# Patient Record
Sex: Male | Born: 1975 | Race: White | Hispanic: No | Marital: Married | Smoking: Current some day smoker
Health system: Southern US, Community
[De-identification: ages and names within clinical notes are randomized; demographics above are authoritative.]

## PROBLEM LIST (undated history)

## (undated) DIAGNOSIS — D179 Benign lipomatous neoplasm, unspecified: Secondary | ICD-10-CM

## (undated) DIAGNOSIS — E291 Testicular hypofunction: Secondary | ICD-10-CM

## (undated) DIAGNOSIS — J189 Pneumonia, unspecified organism: Secondary | ICD-10-CM

## (undated) DIAGNOSIS — I2699 Other pulmonary embolism without acute cor pulmonale: Secondary | ICD-10-CM

## (undated) DIAGNOSIS — K219 Gastro-esophageal reflux disease without esophagitis: Secondary | ICD-10-CM

## (undated) DIAGNOSIS — I82409 Acute embolism and thrombosis of unspecified deep veins of unspecified lower extremity: Secondary | ICD-10-CM

## (undated) DIAGNOSIS — A4902 Methicillin resistant Staphylococcus aureus infection, unspecified site: Secondary | ICD-10-CM

## (undated) DIAGNOSIS — K649 Unspecified hemorrhoids: Secondary | ICD-10-CM

## (undated) DIAGNOSIS — E669 Obesity, unspecified: Secondary | ICD-10-CM

## (undated) DIAGNOSIS — F419 Anxiety disorder, unspecified: Secondary | ICD-10-CM

## (undated) DIAGNOSIS — U071 COVID-19: Secondary | ICD-10-CM

## (undated) HISTORY — DX: Methicillin resistant Staphylococcus aureus infection, unspecified site: A49.02

## (undated) HISTORY — DX: Anxiety disorder, unspecified: F41.9

## (undated) HISTORY — PX: APPENDECTOMY: SHX54

## (undated) HISTORY — PX: FOOT SURGERY: SHX648

## (undated) HISTORY — DX: Other pulmonary embolism without acute cor pulmonale: I26.99

## (undated) HISTORY — PX: COLONOSCOPY: SHX174

## (undated) HISTORY — PX: KNEE ARTHROSCOPY: SHX127

## (undated) HISTORY — DX: Benign lipomatous neoplasm, unspecified: D17.9

## (undated) HISTORY — DX: Obesity, unspecified: E66.9

## (undated) HISTORY — PX: CLOSED REDUCTION ANKLE DISLOCATION: SUR209

## (undated) HISTORY — DX: Pneumonia, unspecified organism: J18.9

## (undated) HISTORY — DX: Unspecified hemorrhoids: K64.9

## (undated) HISTORY — DX: Gastro-esophageal reflux disease without esophagitis: K21.9

## (undated) HISTORY — DX: Testicular hypofunction: E29.1

## (undated) HISTORY — PX: FIBULA FRACTURE SURGERY: SHX947

## (undated) HISTORY — DX: COVID-19: U07.1

## (undated) HISTORY — DX: Acute embolism and thrombosis of unspecified deep veins of unspecified lower extremity: I82.409

---

## 2000-12-13 ENCOUNTER — Emergency Department (HOSPITAL_COMMUNITY): Admission: EM | Admit: 2000-12-13 | Discharge: 2000-12-13 | Payer: Self-pay | Admitting: Emergency Medicine

## 2011-01-18 ENCOUNTER — Other Ambulatory Visit (HOSPITAL_COMMUNITY): Payer: Self-pay | Admitting: Orthopedic Surgery

## 2011-01-18 DIAGNOSIS — M79671 Pain in right foot: Secondary | ICD-10-CM

## 2011-01-18 DIAGNOSIS — M25571 Pain in right ankle and joints of right foot: Secondary | ICD-10-CM

## 2011-01-21 ENCOUNTER — Ambulatory Visit (HOSPITAL_COMMUNITY)
Admission: RE | Admit: 2011-01-21 | Discharge: 2011-01-21 | Disposition: A | Discharge status: Home / Self Care | Payer: Managed Care, Other (non HMO) | Source: Ambulatory Visit | Attending: Orthopedic Surgery | Admitting: Orthopedic Surgery

## 2011-01-21 DIAGNOSIS — M79609 Pain in unspecified limb: Secondary | ICD-10-CM | POA: Insufficient documentation

## 2011-01-21 DIAGNOSIS — Z9889 Other specified postprocedural states: Secondary | ICD-10-CM | POA: Insufficient documentation

## 2011-01-21 DIAGNOSIS — M79671 Pain in right foot: Secondary | ICD-10-CM

## 2011-01-21 DIAGNOSIS — M25571 Pain in right ankle and joints of right foot: Secondary | ICD-10-CM

## 2011-01-21 MED ORDER — TECHNETIUM TC 99M MEDRONATE IV KIT
25.0000 | PACK | Freq: Once | INTRAVENOUS | Status: AC | PRN
  Administered 2011-01-21: 25 via INTRAVENOUS

## 2011-02-22 ENCOUNTER — Inpatient Hospital Stay: Payer: Self-pay | Admitting: Podiatry

## 2011-02-23 DIAGNOSIS — R079 Chest pain, unspecified: Secondary | ICD-10-CM

## 2011-03-03 ENCOUNTER — Other Ambulatory Visit: Payer: Self-pay | Admitting: Podiatry

## 2011-03-05 ENCOUNTER — Ambulatory Visit: Payer: Self-pay | Admitting: Podiatry

## 2012-10-12 ENCOUNTER — Ambulatory Visit: Payer: Self-pay | Admitting: Podiatry

## 2014-03-31 ENCOUNTER — Emergency Department (HOSPITAL_BASED_OUTPATIENT_CLINIC_OR_DEPARTMENT_OTHER)
Admission: EM | Admit: 2014-03-31 | Discharge: 2014-03-31 | Disposition: A | Discharge status: Home / Self Care | Payer: BC Managed Care – PPO | Attending: Emergency Medicine | Admitting: Emergency Medicine

## 2014-03-31 ENCOUNTER — Emergency Department (HOSPITAL_BASED_OUTPATIENT_CLINIC_OR_DEPARTMENT_OTHER): Payer: BC Managed Care – PPO

## 2014-03-31 ENCOUNTER — Encounter (HOSPITAL_BASED_OUTPATIENT_CLINIC_OR_DEPARTMENT_OTHER): Payer: Self-pay

## 2014-03-31 DIAGNOSIS — R1110 Vomiting, unspecified: Secondary | ICD-10-CM

## 2014-03-31 DIAGNOSIS — E860 Dehydration: Secondary | ICD-10-CM | POA: Insufficient documentation

## 2014-03-31 DIAGNOSIS — R197 Diarrhea, unspecified: Secondary | ICD-10-CM | POA: Diagnosis not present

## 2014-03-31 LAB — URINALYSIS, ROUTINE W REFLEX MICROSCOPIC
Bilirubin Urine: NEGATIVE
Glucose, UA: NEGATIVE mg/dL
Hgb urine dipstick: NEGATIVE
KETONES UR: 15 mg/dL — AB
LEUKOCYTES UA: NEGATIVE
NITRITE: NEGATIVE
PH: 7.5 (ref 5.0–8.0)
Protein, ur: NEGATIVE mg/dL
SPECIFIC GRAVITY, URINE: 1.03 (ref 1.005–1.030)
Urobilinogen, UA: 1 mg/dL (ref 0.0–1.0)

## 2014-03-31 LAB — CBC WITH DIFFERENTIAL/PLATELET
BASOS ABS: 0 10*3/uL (ref 0.0–0.1)
Basophils Relative: 0 % (ref 0–1)
EOS PCT: 1 % (ref 0–5)
Eosinophils Absolute: 0.2 10*3/uL (ref 0.0–0.7)
HEMATOCRIT: 47.8 % (ref 39.0–52.0)
HEMOGLOBIN: 16.3 g/dL (ref 13.0–17.0)
Lymphocytes Relative: 3 % — ABNORMAL LOW (ref 12–46)
Lymphs Abs: 0.4 10*3/uL — ABNORMAL LOW (ref 0.7–4.0)
MCH: 30.2 pg (ref 26.0–34.0)
MCHC: 34.1 g/dL (ref 30.0–36.0)
MCV: 88.5 fL (ref 78.0–100.0)
Monocytes Absolute: 0.5 10*3/uL (ref 0.1–1.0)
Monocytes Relative: 5 % (ref 3–12)
NEUTROS ABS: 9.6 10*3/uL — AB (ref 1.7–7.7)
Neutrophils Relative %: 91 % — ABNORMAL HIGH (ref 43–77)
Platelets: 191 10*3/uL (ref 150–400)
RBC: 5.4 MIL/uL (ref 4.22–5.81)
RDW: 12.5 % (ref 11.5–15.5)
WBC: 10.6 10*3/uL — ABNORMAL HIGH (ref 4.0–10.5)

## 2014-03-31 LAB — COMPREHENSIVE METABOLIC PANEL
ALBUMIN: 3.9 g/dL (ref 3.5–5.2)
ALT: 33 U/L (ref 0–53)
ANION GAP: 8 (ref 5–15)
AST: 30 U/L (ref 0–37)
Alkaline Phosphatase: 99 U/L (ref 39–117)
BUN: 21 mg/dL (ref 6–23)
CO2: 25 mmol/L (ref 19–32)
Calcium: 8.7 mg/dL (ref 8.4–10.5)
Chloride: 105 mEq/L (ref 96–112)
Creatinine, Ser: 1.1 mg/dL (ref 0.50–1.35)
GFR calc Af Amer: >90 mL/min (ref >90)
GFR calc non Af Amer: 85 mL/min — ABNORMAL LOW (ref >90)
GLUCOSE: 105 mg/dL — AB (ref 70–99)
Potassium: 4 mmol/L (ref 3.5–5.1)
SODIUM: 138 mmol/L (ref 135–145)
TOTAL PROTEIN: 7.1 g/dL (ref 6.0–8.3)
Total Bilirubin: 0.7 mg/dL (ref 0.3–1.2)

## 2014-03-31 LAB — LIPASE, BLOOD: Lipase: 40 U/L (ref 11–59)

## 2014-03-31 MED ORDER — ONDANSETRON HCL 4 MG/2ML IJ SOLN
4.0000 mg | Freq: Once | INTRAMUSCULAR | Status: AC
  Administered 2014-03-31: 4 mg via INTRAVENOUS
  Filled 2014-03-31: qty 2

## 2014-03-31 MED ORDER — ACETAMINOPHEN 500 MG PO TABS
1000.0000 mg | ORAL_TABLET | Freq: Once | ORAL | Status: DC
  Filled 2014-03-31: qty 2

## 2014-03-31 MED ORDER — SODIUM CHLORIDE 0.9 % IV BOLUS (SEPSIS)
1000.0000 mL | Freq: Once | INTRAVENOUS | Status: AC
  Administered 2014-03-31: 1000 mL via INTRAVENOUS

## 2014-03-31 MED ORDER — ONDANSETRON HCL 4 MG PO TABS: 4.0000 mg | ORAL_TABLET | Freq: Three times a day (TID) | ORAL | Status: AC | PRN

## 2014-03-31 NOTE — ED Notes (Signed)
Patient reports that he awoke with abdominal cramping, nausea, general bodyaches. Also reports that he developed diarrhea with same

## 2014-03-31 NOTE — Discharge Instructions (Signed)
Stay hydrated.   Take zofran for nausea.   You can take imodium as needed for diarrhea. Take up to 3 times daily.   Follow up with your doctor.   Return to ER if you have severe pain, vomiting, fevers, dehydration.

## 2014-03-31 NOTE — ED Provider Notes (Signed)
CSN: [id number]     Arrival date & time 03/31/14  1018 History   First MD Initiated Contact with Patient 03/31/14 1110     Chief Complaint  Patient presents with  . nausea, diarrhea      (Consider location/radiation/quality/duration/timing/severity/associated sxs/prior Treatment) The history is provided by the patient.  Guy Matthews is a 39 y.o. male hx of appendectomy here with abdominal cramps, nausea, general body aches. Woke up this morning with acute onset of abdominal cramps. Also has 15-20 episodes of watery diarrhea. Has some subjective chills and general body aches as well. Denies any recent antibiotic use. Denies any recent travels. No history of SBO.    History reviewed. No pertinent past medical history. History reviewed. No pertinent past surgical history. No family history on file. History  Substance Use Topics  . Smoking status: Never Smoker   . Smokeless tobacco: Not on file  . Alcohol Use: Not on file    Review of Systems  Gastrointestinal: Positive for nausea, abdominal pain and diarrhea.  All other systems reviewed and are negative.     Allergies  Review of patient's allergies indicates no known allergies.  Home Medications   Prior to Admission medications   Medication Sig Start Date End Date Taking? Authorizing Provider  diazepam (VALIUM) 5 MG tablet Take 5 mg by mouth every 12 (twelve) hours as needed for anxiety.   Yes Historical Provider, MD   BP 128/66 mmHg  Pulse 78  Temp(Src) 99.2 F (37.3 C) (Oral)  Resp 18  Wt 220 lb (99.791 kg)  SpO2 100% Physical Exam  Constitutional: He is oriented to person, place, and time.  Uncomfortable, dehydrated   HENT:  Head: Normocephalic.  MM slightly dry   Eyes: Conjunctivae are normal. Pupils are equal, round, and reactive to light.  Neck: Normal range of motion. Neck supple.  Cardiovascular: Normal rate, regular rhythm and normal heart sounds.   Pulmonary/Chest: Effort normal and breath sounds normal.  No respiratory distress. He has no wheezes. He has no rales.  Abdominal: Soft. Bowel sounds are normal. He exhibits no distension. There is no tenderness. There is no rebound and no guarding.  Musculoskeletal: Normal range of motion. He exhibits no edema or tenderness.  Neurological: He is alert and oriented to person, place, and time. No cranial nerve deficit. Coordination normal.  Skin: Skin is warm and dry.  Psychiatric: He has a normal mood and affect. His behavior is normal. Judgment normal.  Nursing note and vitals reviewed.   ED Course  Procedures (including critical care time) Labs Review Labs Reviewed  URINALYSIS, ROUTINE W REFLEX MICROSCOPIC - Abnormal; Notable for the following:    Ketones, ur 15 (*)    All other components within normal limits  CBC WITH DIFFERENTIAL - Abnormal; Notable for the following:    WBC 10.6 (*)    Neutrophils Relative % 91 (*)    Neutro Abs 9.6 (*)    Lymphocytes Relative 3 (*)    Lymphs Abs 0.4 (*)    All other components within normal limits  COMPREHENSIVE METABOLIC PANEL - Abnormal; Notable for the following:    Glucose, Bld 105 (*)    GFR calc non Af Amer 85 (*)    All other components within normal limits  LIPASE, BLOOD    Imaging Review Dg Abd Acute W/chest  03/31/2014   CLINICAL DATA:  Abdominal cramping, nausea, vomiting and diarrhea this morning.  EXAM: ACUTE ABDOMEN SERIES (ABDOMEN 2 VIEW & CHEST 1 VIEW)  COMPARISON:  None.  FINDINGS: There is no evidence of dilated bowel loops or free intraperitoneal air. No radiopaque calculi or other significant radiographic abnormality is seen. Patient is status post prior cholecystectomy. Heart size and mediastinal contours are within normal limits. Both lungs are clear.  IMPRESSION: No bowel obstruction or free air.  No acute cardiopulmonary disease.   Electronically Signed   By: Abelardo Diesel M.D.   On: 03/31/2014 11:49     EKG Interpretation None      MDM   Final diagnoses:  Vomiting     Guy MABEE is a 39 y.o. male here with vomiting, diarrhea. Likely gastro. Will check labs and hydrate. Hx of appendectomy so will get xray to r/o SBO.   1:44 PM Xray showed no SBO. Small amount of ketones in urine but no UTI. Labs unremarkable. Tolerated PO. Will dc home. I told him that he can take imodium prn.     Wandra Arthurs, MD 03/31/14 1345

## 2014-07-05 ENCOUNTER — Encounter (HOSPITAL_COMMUNITY): Payer: Self-pay | Admitting: Emergency Medicine

## 2014-07-05 ENCOUNTER — Emergency Department (HOSPITAL_COMMUNITY): Admission: EM | Admit: 2014-07-05 | Discharge: 2014-07-05 | Discharge status: Absent without leave | Payer: Self-pay

## 2014-07-05 ENCOUNTER — Emergency Department (INDEPENDENT_AMBULATORY_CARE_PROVIDER_SITE_OTHER)
Admission: EM | Admit: 2014-07-05 | Discharge: 2014-07-05 | Disposition: A | Discharge status: Home / Self Care | Payer: Self-pay | Source: Home / Self Care | Attending: Family Medicine | Admitting: Family Medicine

## 2014-07-05 DIAGNOSIS — S060X0A Concussion without loss of consciousness, initial encounter: Secondary | ICD-10-CM

## 2014-07-05 NOTE — Discharge Instructions (Signed)
I want you to go home and relax for at least the next 2 days. If you feel back to normal, you may return to normal daily activities as long as you remain asymptomatic. You may then return to normal physical activity and exercise, again only if you remain asymptomatic.   If your symptoms worsen, go to the emergency department.  Concussion A concussion, or closed-head injury, is a brain injury caused by a direct blow to the head or by a quick and sudden movement (jolt) of the head or neck. Concussions are usually not life-threatening. Even so, the effects of a concussion can be serious. If you have had a concussion before, you are more likely to experience concussion-like symptoms after a direct blow to the head.  CAUSES  Direct blow to the head, such as from running into another player during a soccer game, being hit in a fight, or hitting your head on a hard surface.  A jolt of the head or neck that causes the brain to move back and forth inside the skull, such as in a car crash. SIGNS AND SYMPTOMS The signs of a concussion can be hard to notice. Early on, they may be missed by you, family members, and health care providers. You may look fine but act or feel differently. Symptoms are usually temporary, but they may last for days, weeks, or even longer. Some symptoms may appear right away while others may not show up for hours or days. Every head injury is different. Symptoms include:  Mild to moderate headaches that will not go away.  A feeling of pressure inside your head.  Having more trouble than usual:  Learning or remembering things you have heard.  Answering questions.  Paying attention or concentrating.  Organizing daily tasks.  Making decisions and solving problems.  Slowness in thinking, acting or reacting, speaking, or reading.  Getting lost or being easily confused.  Feeling tired all the time or lacking energy (fatigued).  Feeling drowsy.  Sleep  disturbances.  Sleeping more than usual.  Sleeping less than usual.  Trouble falling asleep.  Trouble sleeping (insomnia).  Loss of balance or feeling lightheaded or dizzy.  Nausea or vomiting.  Numbness or tingling.  Increased sensitivity to:  Sounds.  Lights.  Distractions.  Vision problems or eyes that tire easily.  Diminished sense of taste or smell.  Ringing in the ears.  Mood changes such as feeling sad or anxious.  Becoming easily irritated or angry for little or no reason.  Lack of motivation.  Seeing or hearing things other people do not see or hear (hallucinations). DIAGNOSIS Your health care provider can usually diagnose a concussion based on a description of your injury and symptoms. He or she will ask whether you passed out (lost consciousness) and whether you are having trouble remembering events that happened right before and during your injury. Your evaluation might include:  A brain scan to look for signs of injury to the brain. Even if the test shows no injury, you may still have a concussion.  Blood tests to be sure other problems are not present. TREATMENT  Concussions are usually treated in an emergency department, in urgent care, or at a clinic. You may need to stay in the hospital overnight for further treatment.  Tell your health care provider if you are taking any medicines, including prescription medicines, over-the-counter medicines, and natural remedies. Some medicines, such as blood thinners (anticoagulants) and aspirin, may increase the chance of complications. Also tell  your health care provider whether you have had alcohol or are taking illegal drugs. This information may affect treatment.  Your health care provider will send you home with important instructions to follow.  How fast you will recover from a concussion depends on many factors. These factors include how severe your concussion is, what part of your brain was injured,  your age, and how healthy you were before the concussion.  Most people with mild injuries recover fully. Recovery can take time. In general, recovery is slower in older persons. Also, persons who have had a concussion in the past or have other medical problems may find that it takes longer to recover from their current injury. HOME CARE INSTRUCTIONS General Instructions  Carefully follow the directions your health care provider gave you.  Only take over-the-counter or prescription medicines for pain, discomfort, or fever as directed by your health care provider.  Take only those medicines that your health care provider has approved.  Do not drink alcohol until your health care provider says you are well enough to do so. Alcohol and certain other drugs may slow your recovery and can put you at risk of further injury.  If it is harder than usual to remember things, write them down.  If you are easily distracted, try to do one thing at a time. For example, do not try to watch TV while fixing dinner.  Talk with family members or close friends when making important decisions.  Keep all follow-up appointments. Repeated evaluation of your symptoms is recommended for your recovery.  Watch your symptoms and tell others to do the same. Complications sometimes occur after a concussion. Older adults with a brain injury may have a higher risk of serious complications, such as a blood clot on the brain.  Tell your teachers, school nurse, school counselor, coach, athletic trainer, or work Freight forwarder about your injury, symptoms, and restrictions. Tell them about what you can or cannot do. They should watch for:  Increased problems with attention or concentration.  Increased difficulty remembering or learning new information.  Increased time needed to complete tasks or assignments.  Increased irritability or decreased ability to cope with stress.  Increased symptoms.  Rest. Rest helps the brain to  heal. Make sure you:  Get plenty of sleep at night. Avoid staying up late at night.  Keep the same bedtime hours on weekends and weekdays.  Rest during the day. Take daytime naps or rest breaks when you feel tired.  Limit activities that require a lot of thought or concentration. These include:  Doing homework or job-related work.  Watching TV.  Working on the computer.  Avoid any situation where there is potential for another head injury (football, hockey, soccer, basketball, martial arts, downhill snow sports and horseback riding). Your condition will get worse every time you experience a concussion. You should avoid these activities until you are evaluated by the appropriate follow-up health care providers. Returning To Your Regular Activities You will need to return to your normal activities slowly, not all at once. You must give your body and brain enough time for recovery.  Do not return to sports or other athletic activities until your health care provider tells you it is safe to do so.  Ask your health care provider when you can drive, ride a bicycle, or operate heavy machinery. Your ability to react may be slower after a brain injury. Never do these activities if you are dizzy.  Ask your health care provider about  when you can return to work or school. Preventing Another Concussion It is very important to avoid another brain injury, especially before you have recovered. In rare cases, another injury can lead to permanent brain damage, brain swelling, or death. The risk of this is greatest during the first 7-10 days after a head injury. Avoid injuries by:  Wearing a seat belt when riding in a car.  Drinking alcohol only in moderation.  Wearing a helmet when biking, skiing, skateboarding, skating, or doing similar activities.  Avoiding activities that could lead to a second concussion, such as contact or recreational sports, until your health care provider says it is  okay.  Taking safety measures in your home.  Remove clutter and tripping hazards from floors and stairways.  Use grab bars in bathrooms and handrails by stairs.  Place non-slip mats on floors and in bathtubs.  Improve lighting in dim areas. SEEK MEDICAL CARE IF:  You have increased problems paying attention or concentrating.  You have increased difficulty remembering or learning new information.  You need more time to complete tasks or assignments than before.  You have increased irritability or decreased ability to cope with stress.  You have more symptoms than before. Seek medical care if you have any of the following symptoms for more than 2 weeks after your injury:  Lasting (chronic) headaches.  Dizziness or balance problems.  Nausea.  Vision problems.  Increased sensitivity to noise or light.  Depression or mood swings.  Anxiety or irritability.  Memory problems.  Difficulty concentrating or paying attention.  Sleep problems.  Feeling tired all the time. SEEK IMMEDIATE MEDICAL CARE IF:  You have severe or worsening headaches. These may be a sign of a blood clot in the brain.  You have weakness (even if only in one hand, leg, or part of the face).  You have numbness.  You have decreased coordination.  You vomit repeatedly.  You have increased sleepiness.  One pupil is larger than the other.  You have convulsions.  You have slurred speech.  You have increased confusion. This may be a sign of a blood clot in the brain.  You have increased restlessness, agitation, or irritability.  You are unable to recognize people or places.  You have neck pain.  It is difficult to wake you up.  You have unusual behavior changes.  You lose consciousness. MAKE SURE YOU:  Understand these instructions.  Will watch your condition.  Will get help right away if you are not doing well or get worse. Document Released: 06/05/2003 Document Revised:  03/20/2013 Document Reviewed: 10/05/2012 [facility] Patient Information 2015 [place], [place]. This information is not intended to replace advice given to you by your health care provider. Make sure you discuss any questions you have with your health care provider.
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
## 2018-05-24 ENCOUNTER — Telehealth: Payer: Self-pay | Admitting: Hematology and Oncology

## 2018-05-24 NOTE — Telephone Encounter (Signed)
A new hem appt has been scheduled for the pt to see Dr. Lindi Adie on 2/29 at 8am. Pt aware to arrive 15 minutes early to be checked in on time.

## 2018-05-27 ENCOUNTER — Inpatient Hospital Stay: Payer: BC Managed Care – PPO

## 2018-05-27 ENCOUNTER — Inpatient Hospital Stay: Payer: BC Managed Care – PPO | Attending: Hematology and Oncology | Admitting: Hematology and Oncology

## 2018-05-27 ENCOUNTER — Telehealth: Payer: Self-pay | Admitting: Hematology and Oncology

## 2018-05-27 DIAGNOSIS — F419 Anxiety disorder, unspecified: Secondary | ICD-10-CM

## 2018-05-27 DIAGNOSIS — I82412 Acute embolism and thrombosis of left femoral vein: Secondary | ICD-10-CM | POA: Insufficient documentation

## 2018-05-27 DIAGNOSIS — I2693 Single subsegmental pulmonary embolism without acute cor pulmonale: Secondary | ICD-10-CM | POA: Insufficient documentation

## 2018-05-27 DIAGNOSIS — Z8614 Personal history of Methicillin resistant Staphylococcus aureus infection: Secondary | ICD-10-CM | POA: Insufficient documentation

## 2018-05-27 DIAGNOSIS — Z7901 Long term (current) use of anticoagulants: Secondary | ICD-10-CM

## 2018-05-27 DIAGNOSIS — Z79899 Other long term (current) drug therapy: Secondary | ICD-10-CM | POA: Diagnosis not present

## 2018-05-27 DIAGNOSIS — D6862 Lupus anticoagulant syndrome: Secondary | ICD-10-CM

## 2018-05-27 DIAGNOSIS — I2699 Other pulmonary embolism without acute cor pulmonale: Secondary | ICD-10-CM | POA: Insufficient documentation

## 2018-05-27 NOTE — Telephone Encounter (Signed)
Scheduled per los, declined printout  ° °

## 2018-05-27 NOTE — Progress Notes (Signed)
[place] NOTE  Patient Care Team: Tisovec, Fransico Him, MD as PCP - General (Internal Medicine)  CHIEF COMPLAINTS/PURPOSE OF CONSULTATION:  DVT and PE  HISTORY OF PRESENTING ILLNESS:  Guy Matthews 43 y.o. male is here because of recent diagnosis of left leg DVT and PE in November 2019.  This was following a fracture of the toe which required immobilization of the foot from August to September.  He presented to his podiatrist with her left leg swelling and ultrasound revealed a left leg DVT in his femoral and popliteal veins.  He was sent for CT scan of the chest which revealed a small pulmonary embolism. Small filling defect within a left lower lobe subsegmental branch where there is suggestion of mild arterial expansion, suspicious for small subsegmental pulmonary embolus. No other evidence for pulmonary embolus. No CT features of right heart strain. He was started on anticoagulation with Xarelto and he was sent for hypercoagulability testing which revealed positivity for lupus anticoagulant.  Because of this he was referred to [place] for hematology evaluation.  MEDICAL HISTORY:  MRSA skin infection, anxiety SURGICAL HISTORY: Fibular fracture, ankle dislocation, foot fracture SOCIAL HISTORY: Does not smoke cigarettes, denies recreational drug use, occasionally drinks beer FAMILY HISTORY: Father had a heart attack and had COPD mother also heart disease ALLERGIES:  has No Known Allergies.  MEDICATIONS:  Current Outpatient Medications  Medication Sig Dispense Refill  . diazepam (VALIUM) 5 MG tablet Take 5 mg by mouth every 12 (twelve) hours as needed for anxiety.    . ondansetron (ZOFRAN) 4 MG tablet Take 1 tablet (4 mg total) by mouth every 8 (eight) hours as needed for nausea or vomiting. 12 tablet 0   No current facility-administered medications for this visit.     REVIEW OF SYSTEMS:   Constitutional: Denies fevers, chills or abnormal night sweats Eyes: Denies  blurriness of vision, double vision or watery eyes Ears, nose, mouth, throat, and face: Denies mucositis or sore throat Respiratory: Denies cough, dyspnea or wheezes Cardiovascular: Denies palpitation, chest discomfort or lower extremity swelling Gastrointestinal:  Denies nausea, heartburn or change in bowel habits Skin: Denies abnormal skin rashes Lymphatics: Denies new lymphadenopathy or easy bruising Neurological:Denies numbness, tingling or new weaknesses Behavioral/Psych: Mood is stable, no new changes   All other systems were reviewed with the patient and are negative.  PHYSICAL EXAMINATION: ECOG PERFORMANCE STATUS: 1 - Symptomatic but completely ambulatory  Vitals:   05/27/18 0810  BP: (!) 126/57  Pulse: (!) 57  Resp: 18  Temp: 97.8 F (36.6 C)  SpO2: 99%   Filed Weights   05/27/18 0810  Weight: 227 lb 14.4 oz (103.4 kg)    GENERAL:alert, no distress and comfortable SKIN: skin color, texture, turgor are normal, no rashes or significant lesions EYES: normal, conjunctiva are pink and non-injected, sclera clear OROPHARYNX:no exudate, no erythema and lips, buccal mucosa, and tongue normal  NECK: supple, thyroid normal size, non-tender, without nodularity LYMPH:  no palpable lymphadenopathy in the cervical, axillary or inguinal LUNGS: clear to auscultation and percussion with normal breathing effort HEART: regular rate & rhythm and no murmurs and no lower extremity edema ABDOMEN:abdomen soft, non-tender and normal bowel sounds Musculoskeletal:no cyanosis of digits and no clubbing  PSYCH: alert & oriented x 3 with fluent speech NEURO: no focal motor/sensory deficits .  Patient still  LABORATORY DATA:  I have reviewed the data as listed Lab Results  Component Value Date   WBC 10.6 (H) 03/31/2014  HGB 16.3 03/31/2014   HCT 47.8 03/31/2014   MCV 88.5 03/31/2014   PLT 191 03/31/2014   Lab Results  Component Value Date   NA 138 03/31/2014   K 4.0 03/31/2014   CL  105 03/31/2014   CO2 25 03/31/2014    RADIOGRAPHIC STUDIES: I have personally reviewed the radiological reports and agreed with the findings in the report.  ASSESSMENT AND PLAN:  Pulmonary embolism (HCC) Left lower extremity DVT and PE diagnosed in November when he was seen by podiatry for unexplained left lower extremity swelling.  He underwent ultrasound which revealed DVT in the left femoral vein extending into popliteal and peroneal vein.  [facility] emergency department performed CT angiogram of the chest which revealed PE  Current treatment: Xarelto started November 2019. Possible etiology: Left toe fracture placed in "moonboot", history of multiple leg fractures Lab test performed by Dr. Osborne Casco 1.  Protein S: 88% 2.  Protein C: 118% 3.  Antithrombin activity: 126% 4.  Lupus anticoagulant: Positive for lupus anticoagulant 5.  Prothrombin gene mutation negative 6.  Factor V Leiden: Negative, no mutation identified 7.  Beta-2 GP 1 antibody: Negative 8.  ANA negative  Antiphospholipid antibody syndrome: I discussed with the patient that the presence of lupus anticoagulant indicates that he has antiphospholipid antibody syndrome.  However this needs to be verified and proven.  We would like to run the test today to verify the consistent presence of this antibody to confirm the syndrome.  The implications of positive repeat testing would be that he would need to remain on anticoagulation for life.  If the test is negative then we could consider the immobilization of his leg as the primary etiology and 6 months of anticoagulation would be adequate based upon the fact that this is his first blood clot.  I will call him with the result of this test. Because there is a concern for interaction between Xarelto and the lupus anticoagulant testing, we also plan to repeat this test in May after he finishes 6 months of Xarelto therapy and he holds the Xarelto for 3 days prior to the lab testing. If  the repeat testing is negative then he does not need further anticoagulation.   All questions were answered. The patient knows to call the clinic with any problems, questions or concerns.    Harriette Ohara, MD 05/27/18

## 2018-05-27 NOTE — Assessment & Plan Note (Signed)
Left lower extremity DVT and PE diagnosed in November when he was seen by podiatry for unexplained left lower extremity swelling.  He underwent ultrasound which revealed DVT in the left femoral vein extending into popliteal and peroneal vein.  [facility] emergency department performed CT angiogram of the chest which revealed PE  Current treatment: Xarelto started November 2019. Possible etiology: Left toe fracture placed in "moonboot", history of multiple leg fractures Lab test performed by Dr. Osborne Casco 1.  Protein S: 88% 2.  Protein C: 118% 3.  Antithrombin activity: 126% 4.  Lupus anticoagulant: Positive for lupus anticoagulant 5.  Prothrombin gene mutation negative 6.  Factor V Leiden: Negative, no mutation identified 7.  Beta-2 GP 1 antibody: Negative 8.  ANA negative  Antiphospholipid antibody syndrome: I discussed with the patient that the presence of lupus anticoagulant indicates that he has antiphospholipid antibody syndrome.  However this needs to be verified and proven.  We would like to run the test today to verify the consistent presence of this antibody to confirm the syndrome.  The implications of positive repeat testing would be that he would need to remain on anticoagulation for life.  If the test is negative then we could consider the immobilization of his leg as the primary etiology and 6 months of anticoagulation would be adequate based upon the fact that this is his first blood clot.  I will call him with the result of this test.

## 2018-05-29 ENCOUNTER — Inpatient Hospital Stay: Payer: BC Managed Care – PPO

## 2018-05-29 LAB — LUPUS ANTICOAGULANT PANEL
DRVVT: 66.8 s — AB (ref 0.0–47.0)
PTT Lupus Anticoagulant: 36.8 s (ref 0.0–51.9)

## 2018-05-29 LAB — DRVVT CONFIRM: dRVVT Confirm: 1.6 ratio — ABNORMAL HIGH (ref 0.8–1.2)

## 2018-05-29 LAB — DRVVT MIX: dRVVT Mix: 50.6 s — ABNORMAL HIGH (ref 0.0–47.0)

## 2018-05-30 ENCOUNTER — Telehealth: Payer: Self-pay | Admitting: Hematology and Oncology

## 2018-05-30 NOTE — Telephone Encounter (Signed)
I informed the patient that the repeat testing for the lupus anticoagulant was positive. We discussed the risk of false positivity while patient is on anticoagulation. The plan is to recheck lupus anticoagulant test few weeks after discontinuing Xarelto.  Given the cost factor of coming to our office, he may look to obtain this test with Dr. Loren Racer office. He will inform as how we can help get this test done after stopping Xarelto.

## 2018-06-27 DIAGNOSIS — S80812A Abrasion, left lower leg, initial encounter: Secondary | ICD-10-CM | POA: Diagnosis not present

## 2018-06-27 DIAGNOSIS — I82402 Acute embolism and thrombosis of unspecified deep veins of left lower extremity: Secondary | ICD-10-CM | POA: Diagnosis not present

## 2018-06-27 DIAGNOSIS — I2699 Other pulmonary embolism without acute cor pulmonale: Secondary | ICD-10-CM | POA: Diagnosis not present

## 2018-07-05 DIAGNOSIS — F331 Major depressive disorder, recurrent, moderate: Secondary | ICD-10-CM | POA: Diagnosis not present

## 2018-07-12 ENCOUNTER — Other Ambulatory Visit: Payer: Self-pay | Admitting: *Deleted

## 2018-07-12 DIAGNOSIS — C20 Malignant neoplasm of rectum: Secondary | ICD-10-CM

## 2018-07-13 DIAGNOSIS — F331 Major depressive disorder, recurrent, moderate: Secondary | ICD-10-CM | POA: Diagnosis not present

## 2018-07-19 DIAGNOSIS — F331 Major depressive disorder, recurrent, moderate: Secondary | ICD-10-CM | POA: Diagnosis not present

## 2018-07-26 DIAGNOSIS — F331 Major depressive disorder, recurrent, moderate: Secondary | ICD-10-CM | POA: Diagnosis not present

## 2018-08-01 ENCOUNTER — Inpatient Hospital Stay: Payer: BC Managed Care – PPO | Attending: Hematology and Oncology

## 2018-08-02 DIAGNOSIS — F331 Major depressive disorder, recurrent, moderate: Secondary | ICD-10-CM | POA: Diagnosis not present

## 2018-08-16 ENCOUNTER — Other Ambulatory Visit: Payer: BC Managed Care – PPO

## 2018-09-05 DIAGNOSIS — Z13220 Encounter for screening for lipoid disorders: Secondary | ICD-10-CM | POA: Diagnosis not present

## 2018-09-05 DIAGNOSIS — Z7901 Long term (current) use of anticoagulants: Secondary | ICD-10-CM | POA: Diagnosis not present

## 2018-09-05 DIAGNOSIS — Z86711 Personal history of pulmonary embolism: Secondary | ICD-10-CM | POA: Diagnosis not present

## 2018-09-05 DIAGNOSIS — Z86718 Personal history of other venous thrombosis and embolism: Secondary | ICD-10-CM | POA: Diagnosis not present

## 2018-09-05 DIAGNOSIS — Z131 Encounter for screening for diabetes mellitus: Secondary | ICD-10-CM | POA: Diagnosis not present

## 2018-09-13 DIAGNOSIS — F4322 Adjustment disorder with anxiety: Secondary | ICD-10-CM | POA: Diagnosis not present

## 2018-10-26 DIAGNOSIS — Z86711 Personal history of pulmonary embolism: Secondary | ICD-10-CM | POA: Diagnosis not present

## 2018-10-26 DIAGNOSIS — Z7901 Long term (current) use of anticoagulants: Secondary | ICD-10-CM | POA: Diagnosis not present

## 2018-10-26 DIAGNOSIS — Z86718 Personal history of other venous thrombosis and embolism: Secondary | ICD-10-CM | POA: Diagnosis not present

## 2018-11-02 DIAGNOSIS — Z86718 Personal history of other venous thrombosis and embolism: Secondary | ICD-10-CM | POA: Diagnosis not present

## 2018-11-02 DIAGNOSIS — Z86711 Personal history of pulmonary embolism: Secondary | ICD-10-CM | POA: Diagnosis not present

## 2018-11-02 DIAGNOSIS — Z7901 Long term (current) use of anticoagulants: Secondary | ICD-10-CM | POA: Diagnosis not present

## 2018-11-09 DIAGNOSIS — F331 Major depressive disorder, recurrent, moderate: Secondary | ICD-10-CM | POA: Diagnosis not present

## 2018-11-17 DIAGNOSIS — L280 Lichen simplex chronicus: Secondary | ICD-10-CM | POA: Diagnosis not present

## 2018-11-17 DIAGNOSIS — D225 Melanocytic nevi of trunk: Secondary | ICD-10-CM | POA: Diagnosis not present

## 2018-11-17 DIAGNOSIS — D485 Neoplasm of uncertain behavior of skin: Secondary | ICD-10-CM | POA: Diagnosis not present

## 2018-12-21 DIAGNOSIS — Z86718 Personal history of other venous thrombosis and embolism: Secondary | ICD-10-CM | POA: Diagnosis not present

## 2018-12-21 DIAGNOSIS — Z86711 Personal history of pulmonary embolism: Secondary | ICD-10-CM | POA: Diagnosis not present

## 2019-01-08 DIAGNOSIS — Z125 Encounter for screening for malignant neoplasm of prostate: Secondary | ICD-10-CM | POA: Diagnosis not present

## 2019-01-08 DIAGNOSIS — R5381 Other malaise: Secondary | ICD-10-CM | POA: Diagnosis not present

## 2019-01-08 DIAGNOSIS — Z0000 Encounter for general adult medical examination without abnormal findings: Secondary | ICD-10-CM | POA: Diagnosis not present

## 2019-01-12 DIAGNOSIS — Z1331 Encounter for screening for depression: Secondary | ICD-10-CM | POA: Diagnosis not present

## 2019-01-12 DIAGNOSIS — D6862 Lupus anticoagulant syndrome: Secondary | ICD-10-CM | POA: Diagnosis not present

## 2019-01-12 DIAGNOSIS — R82998 Other abnormal findings in urine: Secondary | ICD-10-CM | POA: Diagnosis not present

## 2019-01-12 DIAGNOSIS — Z7901 Long term (current) use of anticoagulants: Secondary | ICD-10-CM | POA: Diagnosis not present

## 2019-01-12 DIAGNOSIS — Z0000 Encounter for general adult medical examination without abnormal findings: Secondary | ICD-10-CM | POA: Diagnosis not present

## 2019-01-12 DIAGNOSIS — D6869 Other thrombophilia: Secondary | ICD-10-CM | POA: Diagnosis not present

## 2019-01-12 DIAGNOSIS — Z86711 Personal history of pulmonary embolism: Secondary | ICD-10-CM | POA: Diagnosis not present

## 2019-01-16 DIAGNOSIS — Z1212 Encounter for screening for malignant neoplasm of rectum: Secondary | ICD-10-CM | POA: Diagnosis not present

## 2019-01-26 ENCOUNTER — Other Ambulatory Visit: Payer: Self-pay

## 2019-01-26 DIAGNOSIS — Z20822 Contact with and (suspected) exposure to covid-19: Secondary | ICD-10-CM

## 2019-01-28 LAB — NOVEL CORONAVIRUS, NAA: SARS-CoV-2, NAA: NOT DETECTED

## 2019-03-08 DIAGNOSIS — F331 Major depressive disorder, recurrent, moderate: Secondary | ICD-10-CM | POA: Diagnosis not present

## 2019-03-13 DIAGNOSIS — F331 Major depressive disorder, recurrent, moderate: Secondary | ICD-10-CM | POA: Diagnosis not present

## 2020-06-24 ENCOUNTER — Other Ambulatory Visit: Payer: Self-pay | Admitting: Family Medicine

## 2020-06-24 DIAGNOSIS — R2242 Localized swelling, mass and lump, left lower limb: Secondary | ICD-10-CM

## 2020-06-27 ENCOUNTER — Other Ambulatory Visit: Payer: Self-pay | Admitting: Internal Medicine

## 2020-06-27 DIAGNOSIS — M7989 Other specified soft tissue disorders: Secondary | ICD-10-CM

## 2020-06-30 ENCOUNTER — Ambulatory Visit
Admission: RE | Admit: 2020-06-30 | Discharge: 2020-06-30 | Disposition: A | Discharge status: Home / Self Care | Payer: 59 | Source: Ambulatory Visit | Attending: Internal Medicine | Admitting: Internal Medicine

## 2020-06-30 ENCOUNTER — Other Ambulatory Visit: Payer: Self-pay | Admitting: Internal Medicine

## 2020-06-30 ENCOUNTER — Ambulatory Visit
Admission: RE | Admit: 2020-06-30 | Discharge: 2020-06-30 | Disposition: A | Discharge status: Home / Self Care | Payer: 59 | Source: Ambulatory Visit | Attending: Family Medicine | Admitting: Family Medicine

## 2020-06-30 DIAGNOSIS — R222 Localized swelling, mass and lump, trunk: Secondary | ICD-10-CM

## 2020-06-30 DIAGNOSIS — R2242 Localized swelling, mass and lump, left lower limb: Secondary | ICD-10-CM

## 2020-07-10 ENCOUNTER — Other Ambulatory Visit: Payer: Self-pay | Admitting: Internal Medicine

## 2020-07-10 DIAGNOSIS — R2242 Localized swelling, mass and lump, left lower limb: Secondary | ICD-10-CM

## 2020-08-01 ENCOUNTER — Ambulatory Visit
Admission: RE | Admit: 2020-08-01 | Discharge: 2020-08-01 | Disposition: A | Discharge status: Home / Self Care | Payer: 59 | Source: Ambulatory Visit | Attending: Internal Medicine | Admitting: Internal Medicine

## 2020-08-01 ENCOUNTER — Other Ambulatory Visit: Payer: Self-pay

## 2020-08-01 DIAGNOSIS — R2242 Localized swelling, mass and lump, left lower limb: Secondary | ICD-10-CM

## 2020-08-01 MED ORDER — GADOBENATE DIMEGLUMINE 529 MG/ML IV SOLN
20.0000 mL | Freq: Once | INTRAVENOUS | Status: AC | PRN
  Administered 2020-08-01: 20 mL via INTRAVENOUS

## 2021-01-01 ENCOUNTER — Encounter: Payer: Self-pay | Admitting: *Deleted

## 2021-07-06 ENCOUNTER — Other Ambulatory Visit (HOSPITAL_COMMUNITY): Payer: Self-pay | Admitting: Sports Medicine

## 2021-07-06 DIAGNOSIS — R0600 Dyspnea, unspecified: Secondary | ICD-10-CM

## 2021-07-08 ENCOUNTER — Ambulatory Visit (HOSPITAL_COMMUNITY)
Admission: RE | Admit: 2021-07-08 | Discharge: 2021-07-08 | Disposition: A | Discharge status: Home / Self Care | Payer: 59 | Source: Ambulatory Visit | Attending: Sports Medicine | Admitting: Sports Medicine

## 2021-07-08 DIAGNOSIS — R0600 Dyspnea, unspecified: Secondary | ICD-10-CM | POA: Diagnosis present

## 2021-09-02 ENCOUNTER — Encounter (HOSPITAL_COMMUNITY): Payer: 59

## 2021-10-02 ENCOUNTER — Ambulatory Visit (INDEPENDENT_AMBULATORY_CARE_PROVIDER_SITE_OTHER): Payer: 59 | Admitting: Internal Medicine

## 2021-10-02 ENCOUNTER — Encounter: Payer: Self-pay | Admitting: Internal Medicine

## 2021-10-02 VITALS — BP 120/90 | HR 88 | Ht 71.0 in | Wt 235.5 lb

## 2021-10-02 DIAGNOSIS — K6289 Other specified diseases of anus and rectum: Secondary | ICD-10-CM | POA: Diagnosis not present

## 2021-10-02 DIAGNOSIS — Z800 Family history of malignant neoplasm of digestive organs: Secondary | ICD-10-CM

## 2021-10-02 DIAGNOSIS — Z1211 Encounter for screening for malignant neoplasm of colon: Secondary | ICD-10-CM

## 2021-10-02 DIAGNOSIS — R21 Rash and other nonspecific skin eruption: Secondary | ICD-10-CM

## 2021-10-02 MED ORDER — PLENVU 140 G PO SOLR: 1.0000 | Freq: Once | ORAL | 0 refills | Status: AC

## 2021-10-02 NOTE — Patient Instructions (Signed)
If you are age 44 or older, your body mass index should be between 23-30. Your Body mass index is 32.85 kg/m. If this is out of the aforementioned range listed, please consider follow up with your Primary Care Provider.  If you are age 40 or younger, your body mass index should be between 19-25. Your Body mass index is 32.85 kg/m. If this is out of the aformentioned range listed, please consider follow up with your Primary Care Provider.   ________________________________________________________  The [facility] providers would like to encourage you to use [facility] to communicate with providers for non-urgent requests or questions.  Due to long hold times on the telephone, sending your provider a message by [facility] may be a faster and more efficient way to get a response.  Please allow 48 business hours for a response.  Please remember that this is for non-urgent requests.  _______________________________________________________  Dennis Bast have been scheduled for a colonoscopy. Please follow written instructions given to you at your visit today.  Please pick up your prep supplies at the pharmacy within the next 1-3 days. If you use inhalers (even only as needed), please bring them with you on the day of your procedure.  Thank you for allowing [place] to care for you.

## 2021-10-02 NOTE — Progress Notes (Signed)
HISTORY OF PRESENT ILLNESS:  Guy Matthews is a 46 y.o. male, ex [employer] and current [employer] employee, who is sent today by his PCP regarding rectal pain.  Patient tells me that he has had a several year history of fleeting rectal pain that occurs at night.  This awakens him.  Initially twice per year.  A bit more frequently now.  May last up to 1 hour.  He describes his bowel habits as regular.  No bleeding.  He was told that this pain was due to hemorrhoids and was given hemorrhoid therapies.  Also has problems with burning rash in the perianal region for which she saw dermatologist today and was prescribed an ointment (he is not sure of the name).  He has strong family history of colon cancer in 2 uncles as well as a grandparent.  He is concerned and is interested in colon cancer screening as he approaches his 47th birthday.  Office note from his PCP reviewed.  Blood work from his August 31, 2021 shows normal CBC with hemoglobin 16.0.  Stool testing revealed Hemoccult negative stool.  REVIEW OF SYSTEMS:  All non-GI ROS negative as otherwise stated in the HPI except for anxiety  Past Medical History:  Diagnosis Date   Anxiety    COVID    DVT (deep venous thrombosis) (HCC)    LLE   GERD (gastroesophageal reflux disease)    Hemorrhoids    MRSA infection    Multiple lipomas    Obesity    Pneumonia    Pulmonary embolism ([place])    Testicular hypofunction     Past Surgical History:  Procedure Laterality Date   APPENDECTOMY     2014   CLOSED REDUCTION ANKLE DISLOCATION Left    2014   COLONOSCOPY     age 34 or 70   FIBULA FRACTURE SURGERY Left    2014   FOOT SURGERY     3 x left   FOOT SURGERY Right    Jones fracture 5th metatarcel   KNEE ARTHROSCOPY Right     Social History Guy Matthews  reports that he has been smoking cigars. He quit smokeless tobacco use about 16 years ago.  His smokeless tobacco use included chew. He reports current alcohol use. He reports that he does not  use drugs.  family history includes Appendicitis in his father; COPD in his father; Colon cancer in his cousin, maternal grandmother, and paternal uncle; Colon polyps in his father and mother; Diabetes in his paternal uncle; Heart disease in his father and mother; Leukemia in his mother.  No Known Allergies     PHYSICAL EXAMINATION: Vital signs: BP 120/90 (BP Location: Left Arm, Patient Position: Sitting, Cuff Size: Normal)   Pulse 88   Ht '5\' 11"'$  (1.803 m) Comment: height measured without shoes  Wt 235 lb 8 oz (106.8 kg)   BMI 32.85 kg/m   Constitutional: generally well-appearing, no acute distress Psychiatric: alert and oriented x3, cooperative Eyes: extraocular movements intact, anicteric, conjunctiva pink Mouth: oral pharynx moist, no lesions Neck: supple no lymphadenopathy Cardiovascular: heart regular rate and rhythm, no murmur Lungs: clear to auscultation bilaterally Abdomen: soft, nontender, nondistended, no obvious ascites, no peritoneal signs, normal bowel sounds, no organomegaly Rectal: Deferred to colonoscopy Extremities: no clubbing, cyanosis, or lower extremity edema bilaterally Skin: no lesions on visible extremities Neuro: No focal deficits.  Cranial nerves intact  ASSESSMENT:  1.  Fleeting rectal pain as described.  Most consistent with proctalgia fugax  2.  Perirectal rash.  Being addressed by dermatology 3.  Strong family history of colon cancer.   PLAN:  1.  Discussion on proctalgia fugax 2.  Treatment of perianal rash per dermatologist 3.  Schedule high rescreening colonoscopy.The nature of the procedure, as well as the risks, benefits, and alternatives were carefully and thoroughly reviewed with the patient. Ample time for discussion and questions allowed. The patient understood, was satisfied, and agreed to proceed.  4.  Ongoing general medical care with PCP

## 2021-10-29 ENCOUNTER — Encounter: Payer: Self-pay | Admitting: Internal Medicine

## 2021-11-02 ENCOUNTER — Telehealth: Payer: Self-pay | Admitting: Internal Medicine

## 2021-11-02 NOTE — Telephone Encounter (Signed)
PT is calling to have prep medication sent in to [facility] [place]. Please call back to advise.

## 2021-11-03 MED ORDER — PLENVU 140 G PO SOLR: 1.0000 | Freq: Once | ORAL | 0 refills | Status: AC

## 2021-11-03 NOTE — Telephone Encounter (Signed)
Spoke to patient - made sure he had his instructions and told him I resent [place] to the pharmacy.  Patient will call if he has any further questions.

## 2021-11-06 ENCOUNTER — Ambulatory Visit (AMBULATORY_SURGERY_CENTER): Payer: 59 | Admitting: Internal Medicine

## 2021-11-06 ENCOUNTER — Encounter: Payer: Self-pay | Admitting: Internal Medicine

## 2021-11-06 VITALS — BP 107/61 | HR 69 | Temp 98.7°F | Resp 15 | Ht 71.0 in | Wt 235.0 lb

## 2021-11-06 DIAGNOSIS — Z800 Family history of malignant neoplasm of digestive organs: Secondary | ICD-10-CM | POA: Diagnosis not present

## 2021-11-06 DIAGNOSIS — Z1211 Encounter for screening for malignant neoplasm of colon: Secondary | ICD-10-CM

## 2021-11-06 MED ORDER — SODIUM CHLORIDE 0.9 % IV SOLN: 500.0000 mL | INTRAVENOUS | Status: DC

## 2021-11-06 NOTE — Progress Notes (Signed)
Pt's states no medical or surgical changes since previsit or office visit. 

## 2021-11-06 NOTE — Progress Notes (Signed)
HISTORY OF PRESENT ILLNESS:   DARRIO BADE is a 46 y.o. male, ex [employer] and current [employer] employee, who is sent today by his PCP regarding rectal pain.  Patient tells me that he has had a several year history of fleeting rectal pain that occurs at night.  This awakens him.  Initially twice per year.  A bit more frequently now.  May last up to 1 hour.  He describes his bowel habits as regular.  No bleeding.  He was told that this pain was due to hemorrhoids and was given hemorrhoid therapies.  Also has problems with burning rash in the perianal region for which she saw dermatologist today and was prescribed an ointment (he is not sure of the name).  He has strong family history of colon cancer in 2 uncles as well as a grandparent.  He is concerned and is interested in colon cancer screening as he approaches his 47th birthday.  Office note from his PCP reviewed.  Blood work from his August 31, 2021 shows normal CBC with hemoglobin 16.0.  Stool testing revealed Hemoccult negative stool.   REVIEW OF SYSTEMS:   All non-GI ROS negative as otherwise stated in the HPI except for anxiety       Past Medical History:  Diagnosis Date   Anxiety     COVID     DVT (deep venous thrombosis) (HCC)      LLE   GERD (gastroesophageal reflux disease)     Hemorrhoids     MRSA infection     Multiple lipomas     Obesity     Pneumonia     Pulmonary embolism ([place])     Testicular hypofunction             Past Surgical History:  Procedure Laterality Date   APPENDECTOMY        2014   CLOSED REDUCTION ANKLE DISLOCATION Left      2014   COLONOSCOPY        age 75 or 63   FIBULA FRACTURE SURGERY Left      2014   FOOT SURGERY        3 x left   FOOT SURGERY Right      Jones fracture 5th metatarcel   KNEE ARTHROSCOPY Right        Social History BECKET WECKER  reports that he has been smoking cigars. He quit smokeless tobacco use about 16 years ago.  His smokeless tobacco use included chew. He reports  current alcohol use. He reports that he does not use drugs.   family history includes Appendicitis in his father; COPD in his father; Colon cancer in his cousin, maternal grandmother, and paternal uncle; Colon polyps in his father and mother; Diabetes in his paternal uncle; Heart disease in his father and mother; Leukemia in his mother.   No Known Allergies       PHYSICAL EXAMINATION: Vital signs: BP 120/90 (BP Location: Left Arm, Patient Position: Sitting, Cuff Size: Normal)   Pulse 88   Ht '5\' 11"'$  (1.803 m) Comment: height measured without shoes  Wt 235 lb 8 oz (106.8 kg)   BMI 32.85 kg/m   Constitutional: generally well-appearing, no acute distress Psychiatric: alert and oriented x3, cooperative Eyes: extraocular movements intact, anicteric, conjunctiva pink Mouth: oral pharynx moist, no lesions Neck: supple no lymphadenopathy Cardiovascular: heart regular rate and rhythm, no murmur Lungs: clear to auscultation bilaterally Abdomen: soft, nontender, nondistended, no obvious ascites, no peritoneal  signs, normal bowel sounds, no organomegaly Rectal: Deferred to colonoscopy Extremities: no clubbing, cyanosis, or lower extremity edema bilaterally Skin: no lesions on visible extremities Neuro: No focal deficits.  Cranial nerves intact   ASSESSMENT:   1.  Fleeting rectal pain as described.  Most consistent with proctalgia fugax 2.  Perirectal rash.  Being addressed by dermatology 3.  Strong family history of colon cancer.     PLAN:   1.  Discussion on proctalgia fugax 2.  Treatment of perianal rash per dermatologist 3.  Schedule high rescreening colonoscopy.The nature of the procedure, as well as the risks, benefits, and alternatives were carefully and thoroughly reviewed with the patient. Ample time for discussion and questions allowed. The patient understood, was satisfied, and agreed to proceed.  4.  Ongoing general medical care with PCP

## 2021-11-06 NOTE — Progress Notes (Signed)
Report to pacu rn. Vss. Care resumed by rn. 

## 2021-11-06 NOTE — Patient Instructions (Signed)
Resume previous diet and medications. Repeat Colonoscopy in 10 years for screening purposes.  YOU HAD AN ENDOSCOPIC PROCEDURE TODAY AT [facility]:   Refer to the procedure report that was given to you for any specific questions about what was found during the examination.  If the procedure report does not answer your questions, please call your gastroenterologist to clarify.  If you requested that your care partner not be given the details of your procedure findings, then the procedure report has been included in a sealed envelope for you to review at your convenience later.  YOU SHOULD EXPECT: Some feelings of bloating in the abdomen. Passage of more gas than usual.  Walking can help get rid of the air that was put into your GI tract during the procedure and reduce the bloating. If you had a lower endoscopy (such as a colonoscopy or flexible sigmoidoscopy) you may notice spotting of blood in your stool or on the toilet paper. If you underwent a bowel prep for your procedure, you may not have a normal bowel movement for a few days.  Please Note:  You might notice some irritation and congestion in your nose or some drainage.  This is from the oxygen used during your procedure.  There is no need for concern and it should clear up in a day or so.  SYMPTOMS TO REPORT IMMEDIATELY:  Following lower endoscopy (colonoscopy or flexible sigmoidoscopy):  Excessive amounts of blood in the stool  Significant tenderness or worsening of abdominal pains  Swelling of the abdomen that is new, acute  Fever of 100F or higher  For urgent or emergent issues, a gastroenterologist can be reached at any hour by calling [phone]. Do not use MyChart messaging for urgent concerns.    DIET:  We do recommend a small meal at first, but then you may proceed to your regular diet.  Drink plenty of fluids but you should avoid alcoholic beverages for 24 hours.  ACTIVITY:  You should plan to take it easy  for the rest of today and you should NOT DRIVE or use heavy machinery until tomorrow (because of the sedation medicines used during the test).    FOLLOW UP: Our staff will call the number listed on your records the next business day following your procedure.  We will call around 7:15- 8:00 am to check on you and address any questions or concerns that you may have regarding the information given to you following your procedure. If we do not reach you, we will leave a message.  If you develop any symptoms (ie: fever, flu-like symptoms, shortness of breath, cough etc.) before then, please call [phone].  If you test positive for Covid 19 in the 2 weeks post procedure, please call and report this information to [place].    If any biopsies were taken you will be contacted by phone or by letter within the next 1-3 weeks.  Please call us at [phone] if you have not heard about the biopsies in 3 weeks.    SIGNATURES/CONFIDENTIALITY: You and/or your care partner have signed paperwork which will be entered into your electronic medical record.  These signatures attest to the fact that that the information above on your After Visit Summary has been reviewed and is understood.  Full responsibility of the confidentiality of this discharge information lies with you and/or your care-partner.

## 2021-11-06 NOTE — Op Note (Signed)
[place] Patient Name: Demarea Lorey Procedure Date: 11/06/2021 1:42 PM MRN: [id number] Endoscopist: Docia Chuck. Henrene Pastor , MD Age: 46 Referring MD:  Date of Birth: 12/31/1975 Gender: Male Account #: [id number] Procedure:                Colonoscopy Indications:              Colon cancer screening in patient at increased                            risk: Family history of colorectal cancer in                            multiple 2nd degree relatives (grandparent, 2                            uncles) Medicines:                Monitored Anesthesia Care Procedure:                Pre-Anesthesia Assessment:                           - Prior to the procedure, a History and Physical                            was performed, and patient medications and                            allergies were reviewed. The patient's tolerance of                            previous anesthesia was also reviewed. The risks                            and benefits of the procedure and the sedation                            options and risks were discussed with the patient.                            All questions were answered, and informed consent                            was obtained. Prior Anticoagulants: The patient has                            taken no previous anticoagulant or antiplatelet                            agents. ASA Grade Assessment: II - A patient with                            mild systemic disease. After reviewing the risks  and benefits, the patient was deemed in                            satisfactory condition to undergo the procedure.                           After obtaining informed consent, the colonoscope                            was passed under direct vision. Throughout the                            procedure, the patient's blood pressure, pulse, and                            oxygen saturations were monitored continuously. The                             CF [id number] #[id number] was introduced through the anus                            and advanced to the the cecum, identified by                            appendiceal orifice and ileocecal valve. The                            terminal ileum, ileocecal valve, appendiceal                            orifice, and rectum were photographed. The quality                            of the bowel preparation was excellent. The                            colonoscopy was performed without difficulty. The                            patient tolerated the procedure well. The bowel                            preparation used was SUPREP via split dose                            instruction. Scope In: 1:58:10 PM Scope Out: 2:08:11 PM Scope Withdrawal Time: 0 hours 7 minutes 51 seconds  Total Procedure Duration: 0 hours 10 minutes 1 second  Findings:                 The terminal ileum appeared normal.                           The entire examined colon appeared normal on direct  and retroflexion views. Complications:            No immediate complications. Estimated blood loss:                            None. Estimated Blood Loss:     Estimated blood loss: none. Impression:               - The entire examined colon is normal on direct and                            retroflexion views.                           - Normal ileum                           - No specimens collected. Recommendation:           - Repeat colonoscopy in 10 years for screening                            purposes.                           - Patient has a contact number available for                            emergencies. The signs and symptoms of potential                            delayed complications were discussed with the                            patient. Return to normal activities tomorrow.                            Written discharge instructions were provided to the                             patient.                           - Resume previous diet.                           - Continue present medications. Docia Chuck. Henrene Pastor, MD 11/06/2021 2:13:52 PM This report has been signed electronically.

## 2021-11-09 ENCOUNTER — Telehealth: Payer: Self-pay

## 2021-11-09 NOTE — Telephone Encounter (Signed)
Follow up call to pt, no answer. 

## 2021-12-17 IMAGING — MR MR FEMUR*L* WO/W CM
9 series · 40 of 40 positions shown · IV contrast (multihance)
Comparison: None.

CLINICAL DATA: Left upper thigh mass.

EXAM:
MR OF THE LEFT LOWER EXTREMITY WITHOUT AND WITH CONTRAST
TECHNIQUE: Multiplanar, multisequence MR imaging of the left thigh was
performed both before and after administration of intravenous
contrast.
CONTRAST:  20mL MULTIHANCE GADOBENATE DIMEGLUMINE 529 MG/ML IV SOLN

[Series 4: T1 · coronal · 4.0mm · 1.95mm/px · 6 of 54 slices shown (1 of 2)]
[im 1/54]
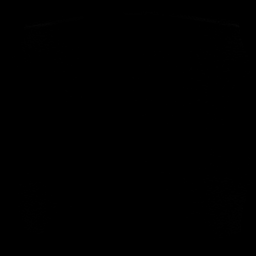
[im 11/54]
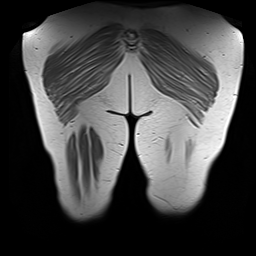
[im 22/54]
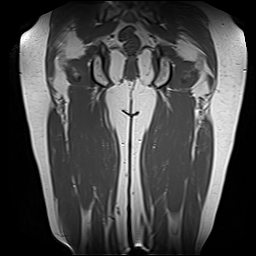
[im 32/54]
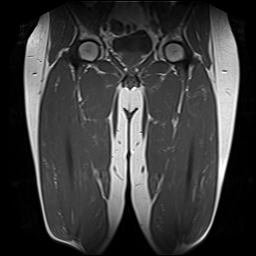
[im 43/54]
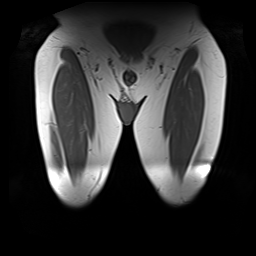
[im 54/54]
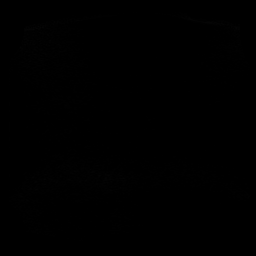

[Series 5: STIR · coronal · 4.0mm · 1.95mm/px · 5 of 54 slices shown]
[im 1/54]
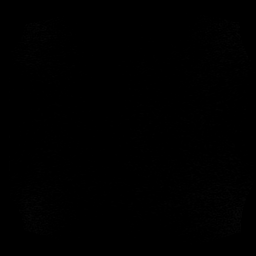
[im 14/54]
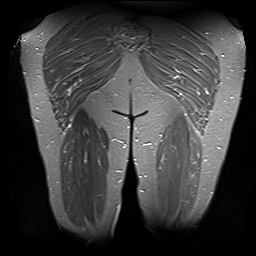
[im 27/54]
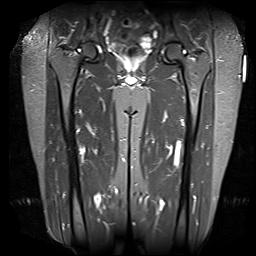
[im 40/54]
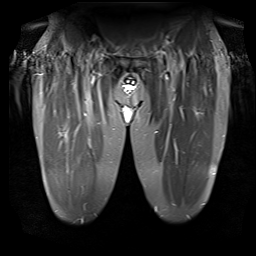
[im 54/54]
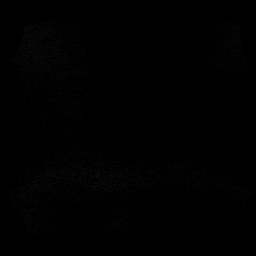

[Series 6: T1 · axial · 5.0mm · 1.02mm/px · z∈[-173,+193]mm · 5 of 62 slices shown (2 of 2)]
[im 1/62]
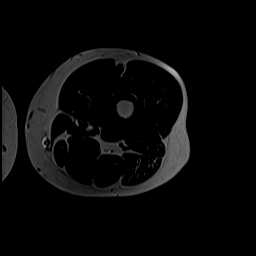
[im 16/62]
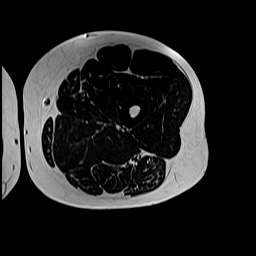
[im 31/62]
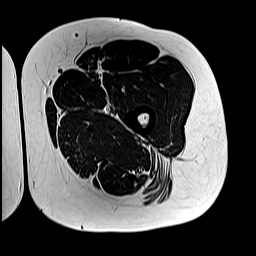
[im 46/62]
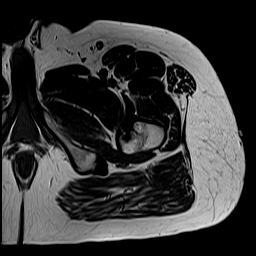
[im 62/62]
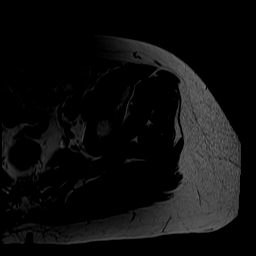

[Series 7: T2 fat-sat · axial · 5.0mm · 0.51mm/px · z∈[-173,+193]mm · 5 of 62 slices shown (1 of 2)]
[im 1/62]
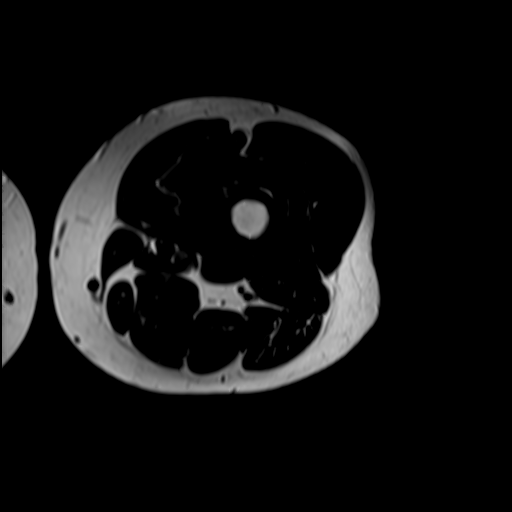
[im 16/62]
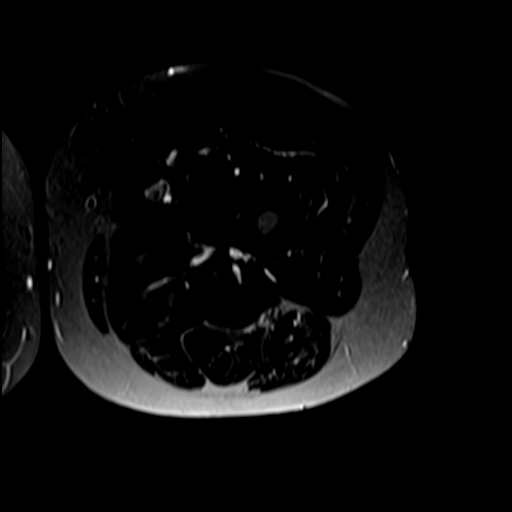
[im 31/62]
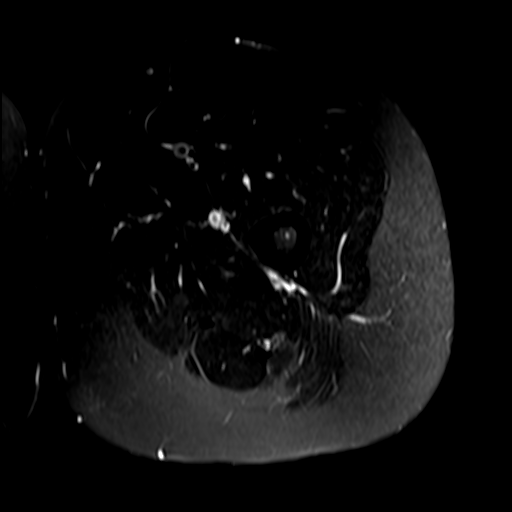
[im 46/62]
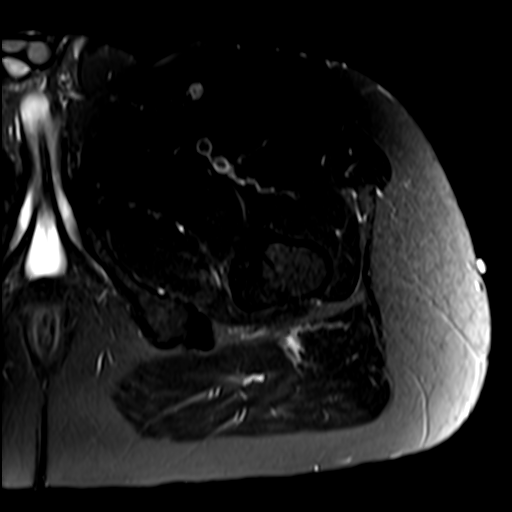
[im 62/62]
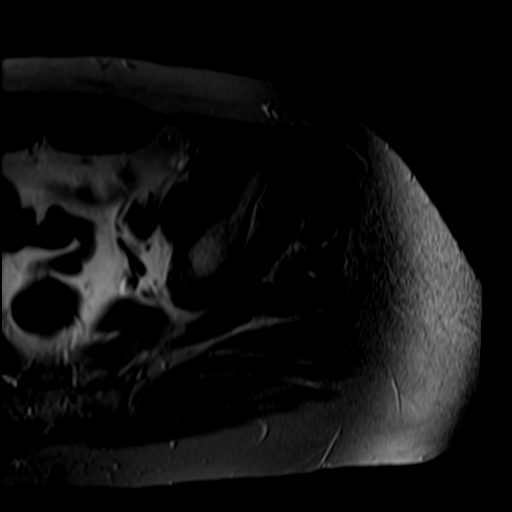

[Series 8: T2 fat-sat · sagittal · 6.0mm · 1.95mm/px · 3 of 33 slices shown (2 of 2)]
[im 1/33]
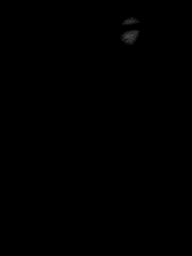
[im 17/33]
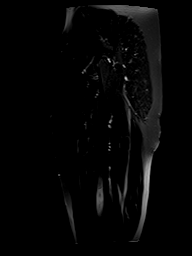
[im 33/33]
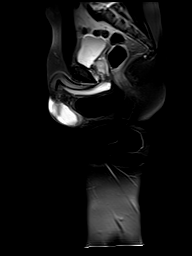

[Series 9: T1 fat-sat · axial · 5.0mm · 1.02mm/px · z∈[-173,+193]mm · 5 of 62 slices shown (1 of 4)]
[im 1/62]
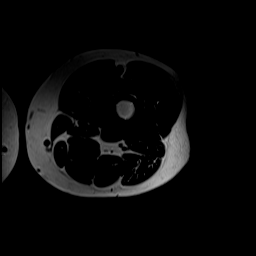
[im 16/62]
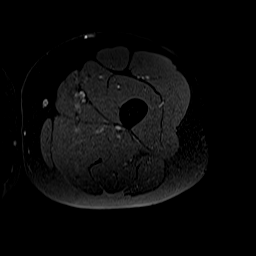
[im 31/62]
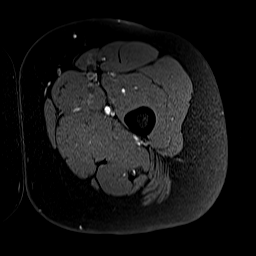
[im 46/62]
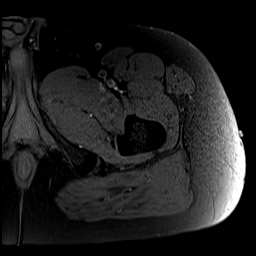
[im 62/62]
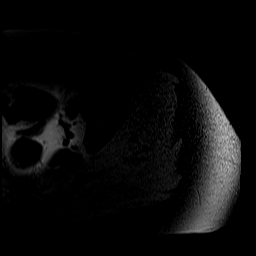

[Series 10: T1 fat-sat · axial · 5.0mm · 1.02mm/px · z∈[-173,+193]mm · 5 of 62 slices shown (2 of 4)]
[im 1/62]
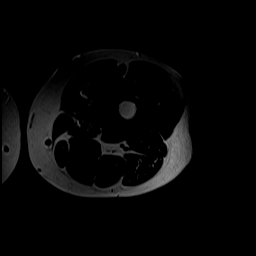
[im 16/62]
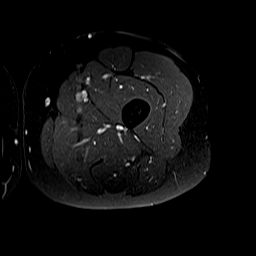
[im 31/62]
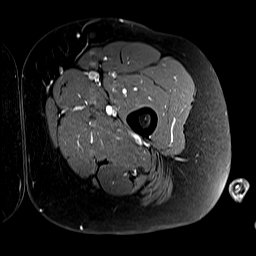
[im 46/62]
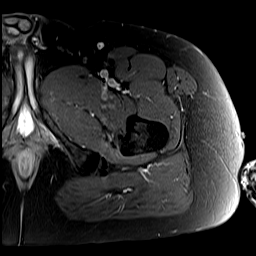
[im 62/62]
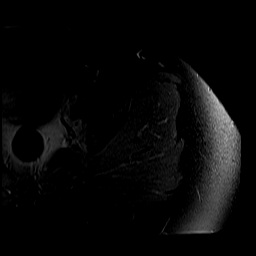

[Series 11: T1 fat-sat · coronal · 6.0mm · 1.95mm/px · 3 of 37 slices shown (3 of 4)]
[im 1/37]
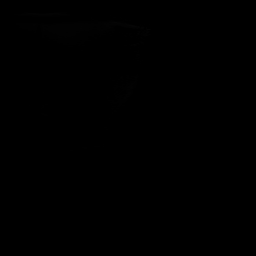
[im 19/37]
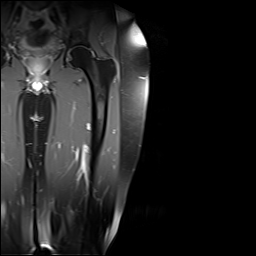
[im 37/37]
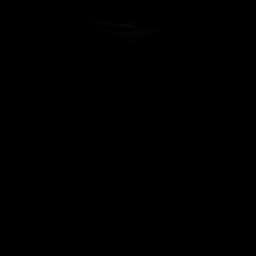

[Series 12: T1 fat-sat · sagittal · 6.0mm · 1.95mm/px · 3 of 33 slices shown (4 of 4)]
[im 1/33]
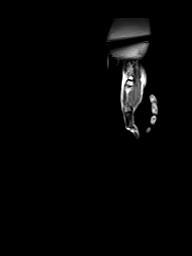
[im 17/33]
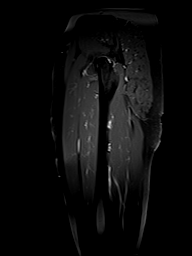
[im 33/33]
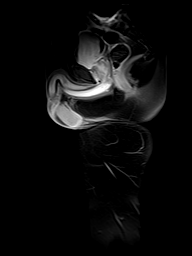

[40 of 40 positions shown; findings below may reference images not displayed]

FINDINGS: Bones/Joint/Cartilage

No marrow signal abnormality. No fracture or dislocation. Normal
alignment. No joint effusion.

Ligaments, Muscles and Tendons
Muscles are normal. No muscle atrophy. No intramuscular fluid
collection or hematoma. No intramuscular soft tissue mass. No areas
of abnormal enhancement.

Soft tissue
No fluid collection or hematoma. No soft tissue mass.
IMPRESSION: No soft tissue mass, fluid collection or hematoma at the site of
clinical concern in the left thigh. The area of concern was marked
with a skin marker.

## 2023-11-08 DIAGNOSIS — Z419 Encounter for procedure for purposes other than remedying health state, unspecified: Secondary | ICD-10-CM | POA: Diagnosis not present

## 2023-11-21 DIAGNOSIS — Z021 Encounter for pre-employment examination: Secondary | ICD-10-CM | POA: Diagnosis not present

## 2023-11-21 DIAGNOSIS — I498 Other specified cardiac arrhythmias: Secondary | ICD-10-CM | POA: Diagnosis not present

## 2023-12-12 NOTE — Progress Notes (Unsigned)
 [facility] [address] [place] [address] Phone: [phone]   Assessment and Plan:     1. Chronic pain of both knees (Primary) -Chronic with exacerbation, initial sports medicine visit - History of intermittent bilateral knee pain with acute flare of primarily right sided knee pain x 3 weeks after heavy workout without specific MOI.  History of knee scope in high school.  Differential includes flare of osteoarthritis versus mild meniscal pathology - Start meloxicam  15 mg daily x2 weeks.  If still having pain after 2 weeks, complete 3rd-week of NSAID. May use remaining NSAID as needed once daily for pain control.  Do not to use additional over-the-counter NSAIDs (ibuprofen, naproxen, Advil, Aleve, etc.) while taking prescription NSAIDs.  May use Tylenol  [phone] mg 2 to 3 times a day for breakthrough pain. -Will obtain x-rays at today's visit and discuss further at follow-up - Start HEP for knees - Recommend low impact exercise such as stationary bike, elliptical, swimming  15 additional minutes spent for educating Therapeutic Home Exercise Program.  This included exercises focusing on stretching, strengthening, with focus on eccentric aspects.   Long term goals include an improvement in range of motion, strength, endurance as well as avoiding reinjury. Patient's frequency would include in 1-2 times a day, 3-5 times a week for a duration of 6-12 weeks. Proper technique shown and discussed handout in great detail with ATC.  All questions were discussed and answered.     Pertinent previous records reviewed include none   Follow Up: 3 to 4 weeks for reevaluation.  Would review knee x-rays.  Could consider CSI versus physical therapy   Subjective:   I, Guy [facility], am serving as a Neurosurgeon for Doctor [facility]  Chief Complaint: bilat knee pain   HPI:   12/13/2023 Patient is a 48 year old male bilat knee pain. Patient  states pain started 3 weeks ago. He lifted heavy while doing lower body. No meds for the pain. Knee pain that radiates to the back popliteal fossa. Right knee pain radiates to the upper back. Thinks he has a baker cyst and a possible tear in his right knee.   Relevant Historical Information: History of PE, not on anticoagulation  Additional pertinent review of systems negative.   Current Outpatient Medications:    meloxicam  (MOBIC ) 15 MG tablet, Take 1 tablet daily for 2 weeks.  If still in pain after 2 weeks, take 1 tablet daily for an additional 1 week., Disp: 30 tablet, Rfl: 0   AMBULATORY NON FORMULARY MEDICATION, Steroid cream Apply to affected area as needed, Disp: , Rfl:    diazepam (VALIUM) 5 MG tablet, Take 5 mg by mouth every 12 (twelve) hours as needed for anxiety., Disp: , Rfl:    Magnesium 200 MG TABS, Take 1 tablet by mouth daily., Disp: , Rfl:    ondansetron  (ZOFRAN ) 4 MG tablet, Take 1 tablet (4 mg total) by mouth every 8 (eight) hours as needed for nausea or vomiting. (Patient not taking: Reported on 10/02/2021), Disp: 12 tablet, Rfl: 0   traZODone (DESYREL) 50 MG tablet, Take 50 mg by mouth at bedtime., Disp: , Rfl:    Objective:     Vitals:   12/13/23 0920  Pulse: 63  SpO2: 97%  Weight: 226 lb (102.5 kg)  Height: 5' 11 (1.803 m)      Body mass index is 31.52 kg/m.    Physical Exam:     General:  awake, alert oriented, no acute distress nontoxic Skin: no suspicious lesions or rashes Neuro:sensation intact and strength 5/5 with no deficits, no atrophy, normal muscle tone Psych: No signs of anxiety, depression or other mood disorder  Bilateral knee: No swelling No deformity Neg fluid wave, joint milking ROM Flex 110, Ext 0 TTP right lateral joint space NTTP over the quad tendon, medial fem condyle, lat fem condyle, patella, plica, patella tendon, tibial tuberostiy, fibular head, posterior fossa, pes anserine bursa, gerdy's tubercle, medial jt line, left lateral  jt line Neg anterior and posterior drawer Neg lachman Neg sag sign Negative varus stress Negative valgus stress Negative McMurray Positive right Thessaly, negative left  Gait normal    Electronically signed by:  Odis [facility] 9:52 AM 12/13/23

## 2023-12-13 ENCOUNTER — Ambulatory Visit (INDEPENDENT_AMBULATORY_CARE_PROVIDER_SITE_OTHER): Admitting: Sports Medicine

## 2023-12-13 ENCOUNTER — Ambulatory Visit

## 2023-12-13 VITALS — HR 63 | Ht 71.0 in | Wt 226.0 lb

## 2023-12-13 DIAGNOSIS — G8929 Other chronic pain: Secondary | ICD-10-CM | POA: Diagnosis not present

## 2023-12-13 DIAGNOSIS — M25561 Pain in right knee: Secondary | ICD-10-CM | POA: Diagnosis not present

## 2023-12-13 DIAGNOSIS — M25562 Pain in left knee: Secondary | ICD-10-CM

## 2023-12-13 MED ORDER — MELOXICAM 15 MG PO TABS: ORAL_TABLET | ORAL | 0 refills | Status: AC

## 2023-12-13 NOTE — Patient Instructions (Addendum)
-   Start meloxicam  15 mg daily x2 weeks.  If still having pain after 2 weeks, complete 3rd-week of NSAID. May use remaining NSAID as needed once daily for pain control.  Do not to use additional over-the-counter NSAIDs (ibuprofen, naproxen, Advil, Aleve, etc.) while taking prescription NSAIDs.  May use Tylenol  [phone] mg 2 to 3 times a day for breakthrough pain.  Knee HEP   Recommend stationary bike, elliptical, water aerobics  Xrays on the way out   4 week follow up

## 2023-12-19 ENCOUNTER — Ambulatory Visit: Payer: Self-pay | Admitting: Sports Medicine
